# Patient Record
Sex: Female | Born: 1975 | Race: White | Hispanic: No | Marital: Married | Smoking: Current every day smoker
Health system: Southern US, Community
[De-identification: ages and names within clinical notes are randomized; demographics above are authoritative.]

## PROBLEM LIST (undated history)

## (undated) DIAGNOSIS — R519 Headache, unspecified: Secondary | ICD-10-CM

## (undated) DIAGNOSIS — E109 Type 1 diabetes mellitus without complications: Secondary | ICD-10-CM

## (undated) DIAGNOSIS — J449 Chronic obstructive pulmonary disease, unspecified: Secondary | ICD-10-CM

## (undated) DIAGNOSIS — F411 Generalized anxiety disorder: Secondary | ICD-10-CM

## (undated) DIAGNOSIS — J45909 Unspecified asthma, uncomplicated: Secondary | ICD-10-CM

## (undated) DIAGNOSIS — Z973 Presence of spectacles and contact lenses: Secondary | ICD-10-CM

## (undated) DIAGNOSIS — G4730 Sleep apnea, unspecified: Secondary | ICD-10-CM

## (undated) DIAGNOSIS — I10 Essential (primary) hypertension: Secondary | ICD-10-CM

## (undated) DIAGNOSIS — R51 Headache: Secondary | ICD-10-CM

## (undated) DIAGNOSIS — K219 Gastro-esophageal reflux disease without esophagitis: Secondary | ICD-10-CM

## (undated) HISTORY — PX: TONSILLECTOMY: SUR1361

## (undated) HISTORY — PX: SKIN CANCER EXCISION: SHX779

## (undated) HISTORY — DX: Headache: R51

## (undated) HISTORY — DX: Gastro-esophageal reflux disease without esophagitis: K21.9

## (undated) HISTORY — DX: Essential (primary) hypertension: I10

## (undated) HISTORY — DX: Type 1 diabetes mellitus without complications: E10.9

## (undated) HISTORY — PX: DILATION AND CURETTAGE OF UTERUS: SHX78

## (undated) HISTORY — DX: Headache, unspecified: R51.9

## (undated) HISTORY — PX: CHOLECYSTECTOMY: SHX55

## (undated) HISTORY — PX: UPPER GI ENDOSCOPY: SHX6162

## (undated) HISTORY — DX: Unspecified asthma, uncomplicated: J45.909

## (undated) HISTORY — DX: Hemochromatosis, unspecified: E83.119

## (undated) HISTORY — DX: Generalized anxiety disorder: F41.1

## (undated) HISTORY — PX: CARPAL TUNNEL RELEASE: SHX101

---

## 2002-02-16 ENCOUNTER — Emergency Department (HOSPITAL_COMMUNITY): Admission: EM | Admit: 2002-02-16 | Discharge: 2002-02-17 | Payer: Self-pay | Admitting: Emergency Medicine

## 2002-07-05 HISTORY — PX: ABDOMINAL HYSTERECTOMY: SHX81

## 2002-07-05 HISTORY — PX: TUBAL LIGATION: SHX77

## 2004-02-20 ENCOUNTER — Ambulatory Visit (HOSPITAL_BASED_OUTPATIENT_CLINIC_OR_DEPARTMENT_OTHER): Admission: RE | Admit: 2004-02-20 | Discharge: 2004-02-20 | Payer: Self-pay | Admitting: Orthopedic Surgery

## 2004-07-05 HISTORY — PX: BUNIONECTOMY: SHX129

## 2006-10-11 ENCOUNTER — Encounter: Admission: RE | Admit: 2006-10-11 | Discharge: 2006-10-11 | Payer: Self-pay | Admitting: Orthopedic Surgery

## 2006-10-12 ENCOUNTER — Ambulatory Visit (HOSPITAL_BASED_OUTPATIENT_CLINIC_OR_DEPARTMENT_OTHER): Admission: RE | Admit: 2006-10-12 | Discharge: 2006-10-12 | Payer: Self-pay | Admitting: Orthopedic Surgery

## 2013-10-25 ENCOUNTER — Other Ambulatory Visit: Payer: Self-pay | Admitting: Sports Medicine

## 2013-10-25 DIAGNOSIS — M25511 Pain in right shoulder: Secondary | ICD-10-CM

## 2013-10-25 DIAGNOSIS — M542 Cervicalgia: Secondary | ICD-10-CM

## 2013-11-01 ENCOUNTER — Other Ambulatory Visit: Payer: Self-pay

## 2013-11-01 ENCOUNTER — Ambulatory Visit
Admission: RE | Admit: 2013-11-01 | Discharge: 2013-11-01 | Disposition: A | Discharge status: Home / Self Care | Payer: Medicaid Other | Source: Ambulatory Visit | Attending: Sports Medicine | Admitting: Sports Medicine

## 2013-11-01 DIAGNOSIS — M542 Cervicalgia: Secondary | ICD-10-CM

## 2013-11-01 DIAGNOSIS — M25511 Pain in right shoulder: Secondary | ICD-10-CM

## 2013-11-27 NOTE — H&P (Signed)
Donnarae Rae/[facility] [address]   [address] [place], [place] [address] [phone] A Division of [facility]  Loreta Ave, M.D.   Robert A. Thurston Hole, M.D.   Burnell Blanks, M.D.   Eulas Post, M.D.   Lunette Stands, M.D Jewel Baize. Eulah Pont, M.D.  Buford Dresser, M.D.  Estell Harpin, M.D.    Melina Fiddler, M.D. Mary L. Isidoro Donning, PA-C  Kirstin A. Shepperson, PA-C  Josh Ionia, PA-C [place], [place]                                                                    RE: Isabel, Wiley                                    [id number]      DOB: 10-02-1975 PROGRESS NOTE: 11-14-13 Reason for visit: Evaluation, referral from Dr. Farris Has for right shoulder pain.   History of present illness: Isabel Wiley is a pleasant 38 year-old woman who has had Type II diabetes for quite awhile.  She has had right shoulder pain for over a year.  It started, she believes, around the time she was doing a lot of vacuuming and mopping.  She does not remember an acute traumatic injury.  Pain has worsened over the year.  It wakes her up at night.  She has pain daily with most every activity over her shoulders.  She has also noticed some weakness in that shoulder.  She has tried an injection that provided relief for two weeks, but elevated her blood sugars significantly for three weeks.  She has done physical therapy, but this did not provide any relief and she was unable to tolerate additional physical therapy.  She has tried home exercises that did not help and she takes NSAIDs regularly that also do not help.  She is notable for having multiple allergies to multiple medicines.     Please see associated documentation for this clinic visit for further past medical, family, surgical and social history, review of systems, and exam findings as this was reviewed by me.  EXAMINATION: Well appearing female in no apparent distress.  Right upper extremity is  neurovascularly intact.  She has significant tenderness over her AC joint and her biceps tendon.  She has a positive Hawkins.  She has 3+/5 strength in her supraspinatus.  She has good infra and subscapularis strength.  She has a positive O'Brien's as well.    X-RAYS: X-rays reviewed by me: previously obtained plain views demonstrate a Type II acromion.  MRI demonstrates bursal sided partial thickness rotator cuff tear.     ASSESSMENT: She is a 38 year-old woman with likely partial thickness rotator cuff tear, subacromial impingement, biceps tendonitis and distal clavicle arthritis.    PLAN: I had a lengthy discussion with her, over 30 minutes, discussing her options.  She could do nothing, she could continue with physical therapy, she could continue with NSAIDs.  She would not like to do an injection, as it raised her blood sugars significantly for three weeks last time.  Surgical treatment I would recommend would be a manipulation, debridement, distal clavicle excision,  subacromial decompression, biceps tenotomy and evaluation of her rotator cuff for possible repair.  I had a lengthy discussion with her that I am concerned that some of this may have a chronic pain component and we may not get her all the way better.  Understanding this she would like to go forward with the above mentioned surgery, as she has failed all non-operative measures for over a year.      Jewel Baizeimothy D.  Eulah PontMurphy, M.D.  Electronically verified by Jewel Baizeimothy D. Eulah PontMurphy, M.D. TDM:jjh D 11-14-13 T 11-15-13

## 2013-12-04 ENCOUNTER — Encounter (HOSPITAL_BASED_OUTPATIENT_CLINIC_OR_DEPARTMENT_OTHER): Payer: Self-pay

## 2013-12-04 ENCOUNTER — Ambulatory Visit (HOSPITAL_BASED_OUTPATIENT_CLINIC_OR_DEPARTMENT_OTHER): Admit: 2013-12-04 | Payer: Medicaid Other | Admitting: Orthopedic Surgery

## 2013-12-04 SURGERY — SHOULDER ARTHROSCOPY WITH SUBACROMIAL DECOMPRESSION
Anesthesia: General | Site: Shoulder | Laterality: Right

## 2014-01-02 ENCOUNTER — Ambulatory Visit (INDEPENDENT_AMBULATORY_CARE_PROVIDER_SITE_OTHER): Payer: Medicaid Other | Admitting: Endocrinology

## 2014-01-02 ENCOUNTER — Encounter: Payer: Self-pay | Admitting: Endocrinology

## 2014-01-02 DIAGNOSIS — J45909 Unspecified asthma, uncomplicated: Secondary | ICD-10-CM

## 2014-01-02 DIAGNOSIS — J4520 Mild intermittent asthma, uncomplicated: Secondary | ICD-10-CM

## 2014-01-02 DIAGNOSIS — G441 Vascular headache, not elsewhere classified: Secondary | ICD-10-CM

## 2014-01-02 DIAGNOSIS — R51 Headache: Secondary | ICD-10-CM

## 2014-01-02 DIAGNOSIS — F411 Generalized anxiety disorder: Secondary | ICD-10-CM

## 2014-01-02 DIAGNOSIS — I10 Essential (primary) hypertension: Secondary | ICD-10-CM

## 2014-01-02 DIAGNOSIS — E109 Type 1 diabetes mellitus without complications: Secondary | ICD-10-CM

## 2014-01-02 DIAGNOSIS — E1049 Type 1 diabetes mellitus with other diabetic neurological complication: Secondary | ICD-10-CM

## 2014-01-02 DIAGNOSIS — K219 Gastro-esophageal reflux disease without esophagitis: Secondary | ICD-10-CM

## 2014-01-02 MED ORDER — INSULIN ASPART 100 UNIT/ML FLEXPEN: 10.0000 [IU] | PEN_INJECTOR | Freq: Three times a day (TID) | SUBCUTANEOUS | Status: DC

## 2014-01-02 MED ORDER — INSULIN DETEMIR 100 UNIT/ML ~~LOC~~ SOLN: 60.0000 [IU] | Freq: Every day | SUBCUTANEOUS | Status: DC

## 2014-01-02 NOTE — Patient Instructions (Addendum)
good diet and exercise habits significanly improve the control of your diabetes.  please let me know if you wish to be referred to a dietician.  high blood sugar is very risky to your health.  you should see an eye doctor and dentist every year.  You are at higher than average risk for pneumonia and hepatitis-B.  You should be vaccinated against both.   controlling your blood pressure and cholesterol drastically reduces the damage diabetes does to your body.  this also applies to quitting smoking.  please discuss these with your doctor.  check your blood sugar twice a day.  vary the time of day when you check, between before the 3 meals, and at bedtime.  also check if you have symptoms of your blood sugar being too high or too low.  please keep a record of the readings and bring it to your next appointment here.  You can write it on any piece of paper.  please call us sooner if your blood sugar goes below 70, or if you have a lot of readings over 200. Please stop taking the januvia, and: Reduce the levemir to 60 units at bedtime, and: Add novolog, 10 units 3 times a day (just before each meal). Please come back for a follow-up appointment in 2 weeks.

## 2014-01-02 NOTE — Progress Notes (Signed)
Subjective:    Patient ID: Isabel Wiley, female    DOB: 07/07/1975, 38 y.o.   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  she has been on insulin since 2013.  pt says her diet is good, but exercise is limited by health problems.  she has never had GDM, pancreatitis, severe hypoglycemia or DKA.  Pt says cbg's vary from 138-200's.  It is in general higher as the day goes on.   Past Medical History  Diagnosis Date  . Type I (juvenile type) diabetes mellitus without mention of complication, not stated as uncontrolled     No past surgical history on file.  History   Social History  . Marital Status: Married    Spouse Name: N/A    Number of Children: N/A  . Years of Education: N/A   Occupational History  . Not on file.   Social History Main Topics  . Smoking status: Current Every Day Smoker -- 0.50 packs/day    Types: Cigarettes  . Smokeless tobacco: Not on file  . Alcohol Use: No  . Drug Use: Not on file  . Sexual Activity: Not on file   Other Topics Concern  . Not on file   Social History Narrative  . No narrative on file    No current outpatient prescriptions on file prior to visit.   No current facility-administered medications on file prior to visit.    Allergies  Allergen Reactions  . Amoxicillin     Hives   . Cefdinir     Yeast infections.   . Celebrex [Celecoxib]     Stings   . Ciprofloxacin     Hives   . Dilaudid [Hydromorphone Hcl]     Hives   . Doxycycline     Hives   . Fluconazole     Hives/blisters   . Hctz [Hydrochlorothiazide]     Hives   . K-Flex [Orphenadrine]     Hives   . Lovaza [Omega-3-Acid Ethyl Esters]     Causes acid   . Metformin And Related     Hives/ upset stomach   . Morphine And Related     Hives     . Niacin And Related     Itching/hives   . Percocet [Oxycodone-Acetaminophen]     Hives    . Ultram [Tramadol]     Hives    . Welchol [Colesevelam Hcl]     Upset stomach     Family History  Problem Relation Age of Onset  . Diabetes Mother   . Diabetes Maternal Grandmother   . Diabetes Maternal Grandfather     BP 116/82  Pulse 90  Temp(Src) 98.6 F (37 C) (Oral)  Ht 5\' 6"  (1.676 m)  Wt 218 lb (98.884 kg)  BMI 35.20 kg/m2  SpO2 96%   Review of Systems denies weight loss, sob, n/v, memory loss, cold intolerance, rhinorrhea, and easy bruising.  She has blurry vision, polyuria, leg cramps, excessive diaphoresis, depression, and headache.  She says she has had neg heart eval for chest pain.     Objective:   Physical Exam VS: see vs page GEN: no distress HEAD: head: no deformity eyes: no periorbital swelling, no proptosis external nose and ears are normal mouth: no lesion seen NECK: supple, thyroid  is not enlarged CHEST WALL: no deformity LUNGS:  Clear to auscultation.   CV: reg rate and rhythm, no murmur.   ABD: abdomen is soft, nontender.  no hepatosplenomegaly.  not distended.  no hernia MUSCULOSKELETAL: muscle bulk and strength are grossly normal.  no obvious joint swelling.  gait is normal and steady EXTEMITIES: no deformity.  no ulcer on the feet.  feet are of normal color and temp.  Trace bilat leg  edema PULSES: dorsalis pedis intact bilat.  no carotid bruit NEURO:  cn 2-12 grossly intact.   readily moves all 4's.  sensation is intact to touch on the feet, but decreased from normal.   SKIN:  Normal texture and temperature.  No rash or suspicious lesion is visible.   NODES:  None palpable at the neck.   PSYCH: alert, well-oriented.  Does not appear anxious nor depressed.  outside test results are reviewed: A1c=9.2  outside office notes are also reviewed.     Assessment & Plan:  DM: severe exacerbation. Neuropathy: this limits exercise rx of DM: This  impairs the ability to achieve glycemic control.  I'll work around this as best I can. Hemochromatosis: the relationship of this to DM is unclear.  In the absence of exocrine insufficiency, we'll rx as though this is unrelated. Depression, new to me: this often limits rx of DM, but she agrees to take multiple daily injections--good.    Patient is advised the following: Patient Instructions  good diet and exercise habits significanly improve the control of your diabetes.  please let me know if you wish to be referred to a dietician.  high blood sugar is very risky to your health.  you should see an eye doctor and dentist every year.  You are at higher than average risk for pneumonia and hepatitis-B.  You should be vaccinated against both.   controlling your blood pressure and cholesterol drastically reduces the damage diabetes does to your body.  this also applies to quitting smoking.  please discuss these with your doctor.  check your blood sugar twice a day.  vary the time of day when you check, between before the 3 meals, and at bedtime.  also check if you have symptoms of your blood sugar being too high or too low.  please keep a record of the readings and bring it to your next appointment here.  You can write it on any piece of paper.  please call us sooner if your blood sugar goes below 70, or if you have a lot of readings over 200. Please stop taking the januvia, and: Reduce the levemir to 60 units at bedtime, and: Add novolog, 10 units 3 times a day (just before each meal). Please come back for a follow-up appointment in 2 weeks.

## 2014-01-04 ENCOUNTER — Encounter: Payer: Self-pay | Admitting: Endocrinology

## 2014-01-04 DIAGNOSIS — R519 Headache, unspecified: Secondary | ICD-10-CM | POA: Insufficient documentation

## 2014-01-04 DIAGNOSIS — R51 Headache: Secondary | ICD-10-CM

## 2014-01-04 DIAGNOSIS — E1049 Type 1 diabetes mellitus with other diabetic neurological complication: Secondary | ICD-10-CM | POA: Insufficient documentation

## 2014-01-04 DIAGNOSIS — K219 Gastro-esophageal reflux disease without esophagitis: Secondary | ICD-10-CM | POA: Insufficient documentation

## 2014-01-04 DIAGNOSIS — F411 Generalized anxiety disorder: Secondary | ICD-10-CM | POA: Insufficient documentation

## 2014-01-04 DIAGNOSIS — J45909 Unspecified asthma, uncomplicated: Secondary | ICD-10-CM | POA: Insufficient documentation

## 2014-01-04 DIAGNOSIS — I10 Essential (primary) hypertension: Secondary | ICD-10-CM | POA: Insufficient documentation

## 2014-01-07 ENCOUNTER — Telehealth: Payer: Self-pay | Admitting: Endocrinology

## 2014-01-07 NOTE — Telephone Encounter (Signed)
Patient states that the Novolog 10 Units is not working  Her blood sugars have been 257-300  Please advise patient   Thank You

## 2014-01-07 NOTE — Telephone Encounter (Signed)
See below and please advise. Thanks!  

## 2014-01-07 NOTE — Telephone Encounter (Signed)
Ok, please increase novolog to 20 units 3 times a day (just before each meal). Please call if this does not help

## 2014-01-07 NOTE — Telephone Encounter (Signed)
Noted, pt advised

## 2014-01-11 NOTE — Telephone Encounter (Signed)
See below and please advise, Thanks!  

## 2014-01-11 NOTE — Telephone Encounter (Signed)
The blood sugar is itself most likely causing the headaches.  please increase novolog to 30 units 3 times a day (just before each meal)

## 2014-01-11 NOTE — Telephone Encounter (Signed)
Noted pt. advised

## 2014-01-11 NOTE — Telephone Encounter (Signed)
Patient stated that Dr went up novalog 20 units 3 times day. Levemir 60 unit at bedtime.   Today still not coming down highest 301 lowest is 203. Patient believes levemir is causing her to have headaches,  Please advise.

## 2014-01-14 ENCOUNTER — Telehealth: Payer: Self-pay

## 2014-01-14 NOTE — Telephone Encounter (Signed)
Blood sugar isn't good enough for this yet.

## 2014-01-14 NOTE — Telephone Encounter (Signed)
Noted, pt advised

## 2014-01-14 NOTE — Telephone Encounter (Signed)
Pt called requesting a medical clearance letter for her diabetes be faxed to Dr. Renea Eeox's [place] in [place].  Please advise, Thanks!

## 2014-01-16 ENCOUNTER — Ambulatory Visit: Payer: Medicaid Other | Admitting: Endocrinology

## 2014-01-17 ENCOUNTER — Telehealth: Payer: Self-pay | Admitting: Endocrinology

## 2014-01-17 ENCOUNTER — Other Ambulatory Visit: Payer: Self-pay | Admitting: *Deleted

## 2014-01-17 MED ORDER — INSULIN ASPART 100 UNIT/ML FLEXPEN: 10.0000 [IU] | PEN_INJECTOR | Freq: Three times a day (TID) | SUBCUTANEOUS | Status: DC

## 2014-01-17 NOTE — Telephone Encounter (Signed)
novolog rx needs to be called in pt is completely out   She is requesting a new rx to be sent in for her new dosage of 30 u three times daily pt would like to have vials no the flexpen

## 2014-01-17 NOTE — Telephone Encounter (Signed)
rx sent

## 2014-01-18 ENCOUNTER — Ambulatory Visit (INDEPENDENT_AMBULATORY_CARE_PROVIDER_SITE_OTHER): Payer: Medicaid Other | Admitting: Endocrinology

## 2014-01-18 ENCOUNTER — Encounter: Payer: Self-pay | Admitting: Endocrinology

## 2014-01-18 VITALS — BP 118/70 | HR 86 | Temp 98.2°F | Ht 66.0 in | Wt 220.0 lb

## 2014-01-18 DIAGNOSIS — E1049 Type 1 diabetes mellitus with other diabetic neurological complication: Secondary | ICD-10-CM

## 2014-01-18 MED ORDER — ACCU-CHEK AVIVA PLUS W/DEVICE KIT: 1.0000 | PACK | Freq: Once | Status: AC

## 2014-01-18 MED ORDER — GLUCOSE BLOOD VI STRP: 1.0000 | ORAL_STRIP | Freq: Two times a day (BID) | Status: AC

## 2014-01-18 MED ORDER — INSULIN ASPART 100 UNIT/ML ~~LOC~~ SOLN: 40.0000 [IU] | Freq: Three times a day (TID) | SUBCUTANEOUS | Status: DC

## 2014-01-18 NOTE — Patient Instructions (Addendum)
check your blood sugar twice a day.  vary the time of day when you check, between before the 3 meals, and at bedtime.  also check if you have symptoms of your blood sugar being too high or too low.  please keep a record of the readings and bring it to your next appointment here.  You can write it on any piece of paper.  please call us sooner if your blood sugar goes below 70, or if you have a lot of readings over 200. Please reduce the levemir to 50 units at bedtime, and: Increase novolog to 40 units 3 times a day (just before each meal). Please come back for a follow-up appointment in 2 months. your surgical risk is low and outweighed by the potential benefit of the surgery.  You are therefore medically , from the standpoint of diabetes.

## 2014-01-18 NOTE — Progress Notes (Signed)
Subjective:    Patient ID: Isabel Wiley, female    DOB: 1976/01/22, 38 y.o.   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  novolog has been increased to 30 units 3 times a day (just before each meal).  she brings a record of her cbg's which i have reviewed today.  It varies from 108-214.  It is in general higher as the day goes on. Past Medical History  Diagnosis Date  . Type I (juvenile type) diabetes mellitus without mention of complication, not stated as uncontrolled   . Hemochromatosis   . GERD (gastroesophageal reflux disease)   . Asthma   . Anxiety state, unspecified   . HTN (hypertension)   . Headache     No past surgical history on file.  History   Social History  . Marital Status: Married    Spouse Name: N/A    Number of Children: N/A  . Years of Education: N/A   Occupational History  . Not on file.   Social History Main Topics  . Smoking status: Current Every Day Smoker -- 0.50 packs/day    Types: Cigarettes  . Smokeless tobacco: Not on file  . Alcohol Use: No  . Drug Use: Not on file  . Sexual Activity: Not on file   Other Topics Concern  . Not on file   Social History Narrative  . No narrative on file    Current Outpatient Prescriptions on File Prior to Visit  Medication Sig Dispense Refill  . albuterol (PROVENTIL) (5 MG/ML) 0.5% nebulizer solution Take 2.5 mg by nebulization every 6 (six) hours as needed for wheezing or shortness of breath.      Marland Kitchen atorvastatin (LIPITOR) 10 MG tablet Take 10 mg by mouth daily.      . beclomethasone (QVAR) 40 MCG/ACT inhaler Inhale into the lungs 2 (two) times daily.      .  butalbital-acetaminophen-caffeine (FIORICET WITH CODEINE) 50-325-40-30 MG per capsule Take 1 capsule by mouth every 4 (four) hours as needed for headache.      . cetirizine (ZYRTEC) 10 MG tablet Take 10 mg by mouth daily.      . Choline Fenofibrate (TRILIPIX) 135 MG capsule Take 135 mg by mouth daily.      . diazepam (VALIUM) 5 MG tablet Take 5 mg by mouth every 6 (six) hours as needed for anxiety.      . docusate sodium (COLACE) 100 MG capsule Take 100 mg by mouth 2 (two) times daily.      Marland Kitchen esomeprazole (NEXIUM) 40 MG capsule Take 40 mg by mouth daily at 12 noon.      . gabapentin (NEURONTIN) 800 MG tablet Take 800 mg by mouth 3 (three) times daily.      Marland Kitchen HYDROcodone-acetaminophen (NORCO/VICODIN) 5-325 MG per tablet Take 1 tablet by mouth every 6 (six) hours as needed for moderate pain.      Marland Kitchen ibuprofen (ADVIL,MOTRIN) 800 MG tablet Take 800 mg by mouth every 8 (eight) hours as needed.      Marland Kitchen losartan (COZAAR) 50 MG tablet Take 50 mg by mouth daily.      . Olopatadine HCl (PATADAY) 0.2 % SOLN Apply  to eye.      . promethazine (PHENERGAN) 25 MG tablet Take 25 mg by mouth every 6 (six) hours as needed for nausea or vomiting.      . SUMAtriptan (IMITREX) 100 MG tablet Take 100 mg by mouth every 2 (two) hours as needed for migraine or headache. May repeat in 2 hours if headache persists or recurs.       No current facility-administered medications on file prior to visit.    Allergies  Allergen Reactions  . Amoxicillin     Hives   . Cefdinir     Yeast infections.   . Celebrex [Celecoxib]     Stings   . Ciprofloxacin     Hives   . Dilaudid [Hydromorphone Hcl]     Hives   . Doxycycline     Hives   . Fluconazole     Hives/blisters   . Hctz [Hydrochlorothiazide]     Hives   . K-Flex [Orphenadrine]     Hives   . Lovaza [Omega-3-Acid Ethyl Esters]     Causes acid   . Metformin And Related     Hives/ upset stomach   . Morphine And Related     Hives   . Niacin And Related      Itching/hives   . Percocet [Oxycodone-Acetaminophen]     Hives    . Ultram [Tramadol]     Hives    . Welchol [Colesevelam Hcl]     Upset stomach     Family History  Problem Relation Age of Onset  . Diabetes Mother   . Diabetes Maternal Grandmother   . Diabetes Maternal Grandfather     BP 118/70  Pulse 86  Temp(Src) 98.2 F (36.8 C) (Oral)  Ht 5\' 6"  (1.676 m)  Wt 220 lb (99.791 kg)  BMI 35.53 kg/m2   Review of Systems She denies hypoglycemia and weight change.      Objective:   Physical Exam VITAL SIGNS:  See vs page GENERAL: no distress SKIN:  Insulin injection sites at the anterior abdomen are normal.        Assessment & Plan:  DM: mild exacerbation, but much improved. Shoulder pain: new to me: the benefits of surgery outweigh risks.     Patient is advised the following: Patient Instructions  check your blood sugar twice a day.  vary the time of day when you check, between before the 3 meals, and at bedtime.  also check if you have symptoms of your blood sugar being too high or too low.  please keep a record of the readings and bring it to your next appointment here.  You can write it on any piece of paper.  please call us sooner if your blood sugar goes below 70, or if you have a lot of readings over 200. Please reduce the levemir to 50 units at bedtime, and: Increase novolog to 40 units 3 times a day (just before each meal). Please come back for a follow-up appointment in 2 months. your surgical risk is low and outweighed by the potential benefit of the surgery.  You are therefore medically , from the standpoint of diabetes.

## 2014-02-04 ENCOUNTER — Encounter (HOSPITAL_BASED_OUTPATIENT_CLINIC_OR_DEPARTMENT_OTHER): Payer: Self-pay | Admitting: *Deleted

## 2014-02-04 NOTE — Progress Notes (Signed)
Pt diabetic-had a stress test 2013-pcp sent her-[facility] did not have records of her-she has sleep apnea-sent cpap back-could not use To bring all meds and overnight bag-to call pcp for any records

## 2014-02-04 NOTE — Progress Notes (Signed)
02/04/14 1250  OBSTRUCTIVE SLEEP APNEA  Have you ever been diagnosed with sleep apnea through a sleep study? Yes  If yes, do you have and use a CPAP or BPAP machine every night? 0  Do you snore loudly (loud enough to be heard through closed doors)?  1  Do you often feel tired, fatigued, or sleepy during the daytime? 1  Has anyone observed you stop breathing during your sleep? 1  Do you have, or are you being treated for high blood pressure? 0  BMI more than 35 kg/m2? 1  Age over 40 years old? 0  Neck circumference greater than 40 cm/16 inches? 1  Gender: 0  Obstructive Sleep Apnea Score 5  Score 4 or greater  Results sent to PCP

## 2014-02-04 NOTE — Progress Notes (Signed)
Reviewed with dr crews-ok 

## 2014-02-08 ENCOUNTER — Ambulatory Visit (HOSPITAL_BASED_OUTPATIENT_CLINIC_OR_DEPARTMENT_OTHER): Payer: Medicaid Other | Admitting: Anesthesiology

## 2014-02-08 ENCOUNTER — Encounter (HOSPITAL_BASED_OUTPATIENT_CLINIC_OR_DEPARTMENT_OTHER): Payer: Medicaid Other | Admitting: Anesthesiology

## 2014-02-08 ENCOUNTER — Ambulatory Visit (HOSPITAL_BASED_OUTPATIENT_CLINIC_OR_DEPARTMENT_OTHER)
Admission: RE | Admit: 2014-02-08 | Discharge: 2014-02-08 | Disposition: A | Discharge status: Home / Self Care | Payer: Medicaid Other | Source: Ambulatory Visit | Attending: Orthopedic Surgery | Admitting: Orthopedic Surgery

## 2014-02-08 ENCOUNTER — Encounter (HOSPITAL_BASED_OUTPATIENT_CLINIC_OR_DEPARTMENT_OTHER)
Admission: RE | Disposition: A | Discharge status: Home / Self Care | Payer: Self-pay | Source: Ambulatory Visit | Attending: Orthopedic Surgery

## 2014-02-08 ENCOUNTER — Encounter (HOSPITAL_BASED_OUTPATIENT_CLINIC_OR_DEPARTMENT_OTHER): Payer: Self-pay

## 2014-02-08 DIAGNOSIS — E119 Type 2 diabetes mellitus without complications: Secondary | ICD-10-CM | POA: Insufficient documentation

## 2014-02-08 DIAGNOSIS — M19019 Primary osteoarthritis, unspecified shoulder: Secondary | ICD-10-CM | POA: Diagnosis not present

## 2014-02-08 DIAGNOSIS — M7500 Adhesive capsulitis of unspecified shoulder: Secondary | ICD-10-CM | POA: Insufficient documentation

## 2014-02-08 HISTORY — DX: Chronic obstructive pulmonary disease, unspecified: J44.9

## 2014-02-08 HISTORY — DX: Presence of spectacles and contact lenses: Z97.3

## 2014-02-08 HISTORY — DX: Sleep apnea, unspecified: G47.30

## 2014-02-08 HISTORY — PX: ARTHOSCOPIC ROTAOR CUFF REPAIR: SHX5002

## 2014-02-08 LAB — POCT I-STAT, CHEM 8
BUN: 12 mg/dL (ref 6–23)
CALCIUM ION: 1.16 mmol/L (ref 1.12–1.23)
CHLORIDE: 104 meq/L (ref 96–112)
CREATININE: 0.8 mg/dL (ref 0.50–1.10)
GLUCOSE: 147 mg/dL — AB (ref 70–99)
HCT: 44 % (ref 36.0–46.0)
Hemoglobin: 15 g/dL (ref 12.0–15.0)
POTASSIUM: 3.9 meq/L (ref 3.7–5.3)
Sodium: 137 mEq/L (ref 137–147)
TCO2: 23 mmol/L (ref 0–100)

## 2014-02-08 LAB — GLUCOSE, CAPILLARY: Glucose-Capillary: 146 mg/dL — ABNORMAL HIGH (ref 70–99)

## 2014-02-08 SURGERY — SHOULDER ARTHROSCOPY WITH SUBACROMIAL DECOMPRESSION AND DISTAL CLAVICLE EXCISION
Anesthesia: General | Site: Shoulder | Laterality: Right

## 2014-02-08 MED ORDER — FENTANYL CITRATE 0.05 MG/ML IJ SOLN
50.0000 ug | INTRAMUSCULAR | Status: DC | PRN
  Administered 2014-02-08: 100 ug via INTRAVENOUS

## 2014-02-08 MED ORDER — BUPIVACAINE-EPINEPHRINE (PF) 0.5% -1:200000 IJ SOLN
INTRAMUSCULAR | Status: DC | PRN
  Administered 2014-02-08: 30 mL via PERINEURAL

## 2014-02-08 MED ORDER — MIDAZOLAM HCL 2 MG/2ML IJ SOLN
INTRAMUSCULAR | Status: AC
  Filled 2014-02-08: qty 2

## 2014-02-08 MED ORDER — OXYCODONE HCL 5 MG/5ML PO SOLN: 5.0000 mg | Freq: Once | ORAL | Status: DC | PRN

## 2014-02-08 MED ORDER — DEXAMETHASONE SODIUM PHOSPHATE 4 MG/ML IJ SOLN
INTRAMUSCULAR | Status: DC | PRN
  Administered 2014-02-08: 8 mg via INTRAVENOUS

## 2014-02-08 MED ORDER — SODIUM CHLORIDE 0.9 % IR SOLN
Status: DC | PRN
  Administered 2014-02-08: 12000 mL

## 2014-02-08 MED ORDER — FENTANYL CITRATE 0.05 MG/ML IJ SOLN
INTRAMUSCULAR | Status: AC
  Filled 2014-02-08: qty 2

## 2014-02-08 MED ORDER — ONDANSETRON HCL 4 MG/2ML IJ SOLN: 4.0000 mg | Freq: Once | INTRAMUSCULAR | Status: DC | PRN

## 2014-02-08 MED ORDER — FENTANYL CITRATE 0.05 MG/ML IJ SOLN
INTRAMUSCULAR | Status: AC
  Filled 2014-02-08: qty 6

## 2014-02-08 MED ORDER — MEPERIDINE HCL 25 MG/ML IJ SOLN: 6.2500 mg | INTRAMUSCULAR | Status: DC | PRN

## 2014-02-08 MED ORDER — ONDANSETRON HCL 4 MG/2ML IJ SOLN
INTRAMUSCULAR | Status: DC | PRN
  Administered 2014-02-08: 4 mg via INTRAVENOUS

## 2014-02-08 MED ORDER — ACETAMINOPHEN 500 MG PO TABS
ORAL_TABLET | ORAL | Status: AC
Start: 2014-02-08 — End: 2014-02-08
  Filled 2014-02-08: qty 2

## 2014-02-08 MED ORDER — OXYCODONE HCL 5 MG PO TABS: 5.0000 mg | ORAL_TABLET | Freq: Once | ORAL | Status: DC | PRN

## 2014-02-08 MED ORDER — HYDROCODONE-ACETAMINOPHEN 5-325 MG PO TABS: 1.0000 | ORAL_TABLET | ORAL | Status: AC | PRN

## 2014-02-08 MED ORDER — CLINDAMYCIN PHOSPHATE 600 MG/50ML IV SOLN
INTRAVENOUS | Status: DC | PRN
  Administered 2014-02-08: 600 mg via INTRAVENOUS

## 2014-02-08 MED ORDER — ACETAMINOPHEN 500 MG PO TABS
1000.0000 mg | ORAL_TABLET | Freq: Once | ORAL | Status: AC
  Administered 2014-02-08: 1000 mg via ORAL

## 2014-02-08 MED ORDER — CEFAZOLIN SODIUM-DEXTROSE 2-3 GM-% IV SOLR: 2.0000 g | INTRAVENOUS | Status: DC

## 2014-02-08 MED ORDER — FENTANYL CITRATE 0.05 MG/ML IJ SOLN
INTRAMUSCULAR | Status: DC | PRN
  Administered 2014-02-08: 100 ug via INTRAVENOUS

## 2014-02-08 MED ORDER — MIDAZOLAM HCL 2 MG/2ML IJ SOLN
1.0000 mg | INTRAMUSCULAR | Status: DC | PRN
  Administered 2014-02-08: 2 mg via INTRAVENOUS

## 2014-02-08 MED ORDER — NEOSTIGMINE METHYLSULFATE 10 MG/10ML IV SOLN
INTRAVENOUS | Status: DC | PRN
  Administered 2014-02-08: 3 mg via INTRAVENOUS

## 2014-02-08 MED ORDER — SUCCINYLCHOLINE CHLORIDE 20 MG/ML IJ SOLN
INTRAMUSCULAR | Status: DC | PRN
  Administered 2014-02-08: 120 mg via INTRAVENOUS

## 2014-02-08 MED ORDER — GLYCOPYRROLATE 0.2 MG/ML IJ SOLN
INTRAMUSCULAR | Status: DC | PRN
  Administered 2014-02-08: 0.4 mg via INTRAVENOUS

## 2014-02-08 MED ORDER — ROCURONIUM BROMIDE 100 MG/10ML IV SOLN
INTRAVENOUS | Status: DC | PRN
  Administered 2014-02-08: 20 mg via INTRAVENOUS

## 2014-02-08 MED ORDER — PROPOFOL 10 MG/ML IV BOLUS
INTRAVENOUS | Status: DC | PRN
  Administered 2014-02-08: 120 mg via INTRAVENOUS
  Administered 2014-02-08: 80 mg via INTRAVENOUS
  Administered 2014-02-08: 60 mg via INTRAVENOUS

## 2014-02-08 MED ORDER — DOCUSATE SODIUM 100 MG PO CAPS: 100.0000 mg | ORAL_CAPSULE | Freq: Two times a day (BID) | ORAL | Status: AC

## 2014-02-08 MED ORDER — HYDROMORPHONE HCL PF 1 MG/ML IJ SOLN: 0.2500 mg | INTRAMUSCULAR | Status: DC | PRN

## 2014-02-08 MED ORDER — DEXTROSE-NACL 5-0.45 % IV SOLN: 100.0000 mL/h | INTRAVENOUS | Status: DC

## 2014-02-08 MED ORDER — LIDOCAINE HCL (CARDIAC) 20 MG/ML IV SOLN
INTRAVENOUS | Status: DC | PRN
  Administered 2014-02-08: 100 mg via INTRAVENOUS

## 2014-02-08 MED ORDER — LACTATED RINGERS IV SOLN
INTRAVENOUS | Status: DC
  Administered 2014-02-08 (×2): via INTRAVENOUS

## 2014-02-08 MED ORDER — ONDANSETRON HCL 4 MG PO TABS: 4.0000 mg | ORAL_TABLET | Freq: Three times a day (TID) | ORAL | Status: AC | PRN

## 2014-02-08 MED ORDER — EPHEDRINE SULFATE 50 MG/ML IJ SOLN
INTRAMUSCULAR | Status: DC | PRN
  Administered 2014-02-08: 10 mg via INTRAVENOUS
  Administered 2014-02-08: 5 mg via INTRAVENOUS

## 2014-02-08 MED ORDER — CLINDAMYCIN PHOSPHATE 600 MG/50ML IV SOLN
INTRAVENOUS | Status: AC
  Filled 2014-02-08: qty 50

## 2014-02-08 SURGICAL SUPPLY — 74 items
ANCH SUT SWLK 19.1X4.75 (Anchor) ×1 IMPLANT
ANCHOR SUT BIO SW 4.75X19.1 (Anchor) ×2 IMPLANT
BLADE CUTTER GATOR 3.5 (BLADE) ×3 IMPLANT
BLADE CUTTER MENIS 5.5 (BLADE) IMPLANT
BLADE GREAT WHITE 4.2 (BLADE) IMPLANT
BLADE GREAT WHITE 4.2MM (BLADE)
BUR OVAL 4.0 (BURR) IMPLANT
BUR OVAL 6.0 (BURR) ×3 IMPLANT
CANISTER SUCT 3000ML (MISCELLANEOUS) IMPLANT
CANNULA 5.75X71 LONG (CANNULA) IMPLANT
CANNULA TWIST IN 8.25X7CM (CANNULA) IMPLANT
CHLORAPREP W/TINT 26ML (MISCELLANEOUS) ×3 IMPLANT
CLOSURE WOUND 1/2 X4 (GAUZE/BANDAGES/DRESSINGS) ×1
DECANTER SPIKE VIAL GLASS SM (MISCELLANEOUS) IMPLANT
DRAPE INCISE IOBAN 66X45 STRL (DRAPES) ×3 IMPLANT
DRAPE SHOULDER BEACH CHAIR (DRAPES) ×4 IMPLANT
DRAPE U 20/CS (DRAPES) ×3 IMPLANT
DRAPE U-SHAPE 47X51 STRL (DRAPES) ×3 IMPLANT
DRSG PAD ABDOMINAL 8X10 ST (GAUZE/BANDAGES/DRESSINGS) ×3 IMPLANT
ELECT NDL TIP 2.8 STRL (NEEDLE) IMPLANT
ELECT NEEDLE TIP 2.8 STRL (NEEDLE) IMPLANT
ELECT REM PT RETURN 9FT ADLT (ELECTROSURGICAL) ×3
ELECTRODE REM PT RTRN 9FT ADLT (ELECTROSURGICAL) ×1 IMPLANT
GAUZE SPONGE 4X4 12PLY STRL (GAUZE/BANDAGES/DRESSINGS) ×3 IMPLANT
GAUZE XEROFORM 1X8 LF (GAUZE/BANDAGES/DRESSINGS) IMPLANT
GLOVE BIO SURGEON STRL SZ7.5 (GLOVE) ×3 IMPLANT
GLOVE BIO SURGEON STRL SZ8 (GLOVE) ×2 IMPLANT
GLOVE BIOGEL PI IND STRL 8 (GLOVE) ×2 IMPLANT
GLOVE BIOGEL PI IND STRL 8.5 (GLOVE) IMPLANT
GLOVE BIOGEL PI INDICATOR 8 (GLOVE) ×4
GLOVE BIOGEL PI INDICATOR 8.5 (GLOVE)
GLOVE EXAM NITRILE MD LF STRL (GLOVE) ×2 IMPLANT
GLOVE SURG SS PI 7.0 STRL IVOR (GLOVE) ×2 IMPLANT
GOWN STRL REUS W/ TWL LRG LVL3 (GOWN DISPOSABLE) ×2 IMPLANT
GOWN STRL REUS W/ TWL XL LVL3 (GOWN DISPOSABLE) ×1 IMPLANT
GOWN STRL REUS W/TWL LRG LVL3 (GOWN DISPOSABLE) ×6
GOWN STRL REUS W/TWL XL LVL3 (GOWN DISPOSABLE) ×3
MANIFOLD NEPTUNE II (INSTRUMENTS) ×3 IMPLANT
NDL SCORPION MULTI FIRE (NEEDLE) IMPLANT
NDL SUT 6 .5 CRC .975X.05 MAYO (NEEDLE) IMPLANT
NEEDLE MAYO TAPER (NEEDLE)
NEEDLE SCORPION MULTI FIRE (NEEDLE) ×3 IMPLANT
NS IRRIG 1000ML POUR BTL (IV SOLUTION) IMPLANT
PACK ARTHROSCOPY DSU (CUSTOM PROCEDURE TRAY) ×3 IMPLANT
PACK BASIN DAY SURGERY FS (CUSTOM PROCEDURE TRAY) ×3 IMPLANT
PENCIL BUTTON HOLSTER BLD 10FT (ELECTRODE) IMPLANT
SET ARTHROSCOPY TUBING (MISCELLANEOUS) ×3
SET ARTHROSCOPY TUBING LN (MISCELLANEOUS) ×1 IMPLANT
SLEEVE SCD COMPRESS KNEE MED (MISCELLANEOUS) ×3 IMPLANT
SLING ARM IMMOBILIZER LRG (SOFTGOODS) ×2 IMPLANT
SLING ARM IMMOBILIZER MED (SOFTGOODS) IMPLANT
SLING ARM LRG ADULT FOAM STRAP (SOFTGOODS) IMPLANT
SLING ARM MED ADULT FOAM STRAP (SOFTGOODS) IMPLANT
SLING ARM XL FOAM STRAP (SOFTGOODS) IMPLANT
SPONGE LAP 4X18 X RAY DECT (DISPOSABLE) IMPLANT
STRIP CLOSURE SKIN 1/2X4 (GAUZE/BANDAGES/DRESSINGS) ×1 IMPLANT
SUCTION FRAZIER TIP 10 FR DISP (SUCTIONS) IMPLANT
SUT ETHIBOND 2 OS 4 DA (SUTURE) IMPLANT
SUT ETHILON 2 0 FS 18 (SUTURE) IMPLANT
SUT ETHILON 3 0 PS 1 (SUTURE) IMPLANT
SUT FIBERWIRE #2 38 T-5 BLUE (SUTURE)
SUT MNCRL AB 3-0 PS2 18 (SUTURE) ×3 IMPLANT
SUT TIGER TAPE 7 IN WHITE (SUTURE) ×2 IMPLANT
SUT VIC AB 0 CT1 27 (SUTURE)
SUT VIC AB 0 CT1 27XBRD ANBCTR (SUTURE) IMPLANT
SUT VIC AB 2-0 SH 27 (SUTURE)
SUT VIC AB 2-0 SH 27XBRD (SUTURE) IMPLANT
SUT VIC AB 3-0 FS2 27 (SUTURE) IMPLANT
SUTURE FIBERWR #2 38 T-5 BLUE (SUTURE) IMPLANT
TAPE FIBER 2MM 7IN #2 BLUE (SUTURE) IMPLANT
TOWEL OR 17X24 6PK STRL BLUE (TOWEL DISPOSABLE) ×3 IMPLANT
TOWEL OR NON WOVEN STRL DISP B (DISPOSABLE) ×3 IMPLANT
WAND STAR VAC 90 (SURGICAL WAND) ×3 IMPLANT
YANKAUER SUCT BULB TIP NO VENT (SUCTIONS) IMPLANT

## 2014-02-08 NOTE — Anesthesia Procedure Notes (Addendum)
Anesthesia Regional Block:  Interscalene brachial plexus block  Pre-Anesthetic Checklist: ,, timeout performed, Correct Patient, Correct Site, Correct Laterality, Correct Procedure, Correct Position, site marked, Risks and benefits discussed,  Surgical consent,  Pre-op evaluation,  At surgeon's request and post-op pain management  Laterality: Right  Prep: chloraprep       Needles:  Injection technique: Single-shot  Needle Type: Echogenic Stimulator Needle     Needle Length: 5cm 5 cm     Additional Needles:  Procedures: ultrasound guided (picture in chart) and nerve stimulator Interscalene brachial plexus block  Nerve Stimulator or Paresthesia:  Response: 0.4 mA,   Additional Responses:   Narrative:  Start time: 02/08/2014 11:40 AM End time: 02/08/2014 11:50 AM Injection made incrementally with aspirations every 5 mL.  Performed by: Personally  Anesthesiologist: Arta BruceKevin Ossey MD  Additional Notes: Monitors applied. Patient sedated. Sterile prep and drape,hand hygiene and sterile gloves were used. Relevant anatomy identified.Needle position confirmed.Local anesthetic injected incrementally after negative aspiration. Local anesthetic spread visualized around nerve(s). Vascular puncture avoided. No complications. Image printed for medical record.The patient tolerated the procedure well.        Procedure Name: Intubation Date/Time: 02/08/2014 1:04 PM Performed by: Renella CunasHAZEL, Genelle Economou D Pre-anesthesia Checklist: Patient identified, Emergency Drugs available, Suction available and Patient being monitored Patient Re-evaluated:Patient Re-evaluated prior to inductionOxygen Delivery Method: Circle System Utilized Preoxygenation: Pre-oxygenation with 100% oxygen Intubation Type: IV induction Ventilation: Mask ventilation without difficulty Laryngoscope Size: Mac and 3 Grade View: Grade I Tube type: Oral Tube size: 7.0 mm Number of attempts: 1 Airway Equipment and Method: stylet and oral  airway Placement Confirmation: ETT inserted through vocal cords under direct vision,  positive ETCO2 and breath sounds checked- equal and bilateral Secured at: 22 cm Tube secured with: Tape Dental Injury: Teeth and Oropharynx as per pre-operative assessment

## 2014-02-08 NOTE — Anesthesia Postprocedure Evaluation (Signed)
Anesthesia Post Note  Patient: Jones Balesllison Safer  Procedure(s) Performed: Procedure(s) (LRB): RIGHT SHOULDER MANIPULATION UNDER ANESTHESIA; ARTHROSCOPY SHOULDER;  DISTAL CLAVICULECTOMY, EXTENSIVE DEBRIDEMENT; SUBACROMIAL DECOMPRESSION;  PARTIAL  ACROMIOPLASTY WITH CORACOACROMIAL RELEASE (Right) ARTHROSCOPIC ROTATOR CUFF REPAIR (Right)  Anesthesia type: general  Patient location: PACU  Post pain: Pain level controlled  Post assessment: Patient's Cardiovascular Status Stable  Last Vitals:  Filed Vitals:   02/08/14 1515  BP: 135/75  Pulse: 98  Temp:   Resp: 18    Post vital signs: Reviewed and stable  Level of consciousness: sedated  Complications: No apparent anesthesia complications

## 2014-02-08 NOTE — Transfer of Care (Signed)
Immediate Anesthesia Transfer of Care Note  Patient: Isabel Wiley  Procedure(s) Performed: Procedure(s) (LRB): RIGHT SHOULDER MANIPULATION UNDER ANESTHESIA; ARTHROSCOPY SHOULDER;  DISTAL CLAVICULECTOMY, EXTENSIVE DEBRIDEMENT; SUBACROMIAL DECOMPRESSION;  PARTIAL  ACROMIOPLASTY WITH CORACOACROMIAL RELEASE (Right) ARTHROSCOPIC ROTATOR CUFF REPAIR (Right)  Patient Location: PACU  Anesthesia Type: General  Level of Consciousness: awake, oriented, sedated and patient cooperative  Airway & Oxygen Therapy: Patient Spontanous Breathing and Patient connected to face mask oxygen  Post-op Assessment: Report given to PACU RN and Post -op Vital signs reviewed and stable  Post vital signs: Reviewed and stable  Complications: No apparent anesthesia complications

## 2014-02-08 NOTE — Discharge Instructions (Signed)
Sling full time.  You may remove your dressings on Wednesday and shower then, no soaking    Post Anesthesia Home Care Instructions  Activity: Get plenty of rest for the remainder of the day. A responsible adult should stay with you for 24 hours following the procedure.  For the next 24 hours, DO NOT: -Drive a car -Advertising copywriterperate machinery -Drink alcoholic beverages -Take any medication unless instructed by your physician -Make any legal decisions or sign important papers.  Meals: Start with liquid foods such as gelatin or soup. Progress to regular foods as tolerated. Avoid greasy, spicy, heavy foods. If nausea and/or vomiting occur, drink only clear liquids until the nausea and/or vomiting subsides. Call your physician if vomiting continues.  Special Instructions/Symptoms: Your throat may feel dry or sore from the anesthesia or the breathing tube placed in your throat during surgery. If this causes discomfort, gargle with warm salt water. The discomfort should disappear within 24 hours.  Regional Anesthesia Blocks  1. Numbness or the inability to move the "blocked" extremity may last from 3-48 hours after placement. The length of time depends on the medication injected and your individual response to the medication. If the numbness is not going away after 48 hours, call your surgeon.  2. The extremity that is blocked will need to be protected until the numbness is gone and the  Strength has returned. Because you cannot feel it, you will need to take extra care to avoid injury. Because it may be weak, you may have difficulty moving it or using it. You may not know what position it is in without looking at it while the block is in effect.  3. For blocks in the legs and feet, returning to weight bearing and walking needs to be done carefully. You will need to wait until the numbness is entirely gone and the strength has returned. You should be able to move your leg and foot normally before you try  and bear weight or walk. You will need someone to be with you when you first try to ensure you do not fall and possibly risk injury.  4. Bruising and tenderness at the needle site are common side effects and will resolve in a few days.  5. Persistent numbness or new problems with movement should be communicated to the surgeon or the [facility] [phone] [facility] [phone]).   Call your surgeon if you experience:   1.  Fever over 101.0. 2.  Inability to urinate. 3.  Nausea and/or vomiting. 4.  Extreme swelling or bruising at the surgical site. 5.  Continued bleeding from the incision. 6.  Increased pain, redness or drainage from the incision. 7.  Problems related to your pain medication. 8. Any change in color, movement and/or sensation 9. Any problems and/or concerns

## 2014-02-08 NOTE — H&P (Signed)
[facility] [address]   [address] [place], [place] [address] [phone] A Division of [facility]  Loreta Ave, M.D.   Robert A. Thurston Hole, M.D.   Burnell Blanks, M.D.   Eulas Post, M.D.   Lunette Stands, M.D Jewel Baize. Eulah Pont, M.D.  Buford Dresser, M.D.  Estell Harpin, M.D.    Melina Fiddler, M.D. Mary L. Isidoro Donning, PA-C  Kirstin A. Shepperson, PA-C  Josh Ionia, PA-C [place], [place]                                                                    RE: Isabel, Wiley                                    [id number]      DOB: 10-02-1975 PROGRESS NOTE: 11-14-13 Reason for visit: Evaluation, referral from Dr. Farris Has for right shoulder pain.   History of present illness: Isabel Wiley is a pleasant 38 year-old woman who has had Type II diabetes for quite awhile.  She has had right shoulder pain for over a year.  It started, she believes, around the time she was doing a lot of vacuuming and mopping.  She does not remember an acute traumatic injury.  Pain has worsened over the year.  It wakes her up at night.  She has pain daily with most every activity over her shoulders.  She has also noticed some weakness in that shoulder.  She has tried an injection that provided relief for two weeks, but elevated her blood sugars significantly for three weeks.  She has done physical therapy, but this did not provide any relief and she was unable to tolerate additional physical therapy.  She has tried home exercises that did not help and she takes NSAIDs regularly that also do not help.  She is notable for having multiple allergies to multiple medicines.     Please see associated documentation for this clinic visit for further past medical, family, surgical and social history, review of systems, and exam findings as this was reviewed by me.  EXAMINATION: Well appearing female in no apparent distress.  Right upper extremity is  neurovascularly intact.  She has significant tenderness over her AC joint and her biceps tendon.  She has a positive Hawkins.  She has 3+/5 strength in her supraspinatus.  She has good infra and subscapularis strength.  She has a positive O'Brien's as well.    X-RAYS: X-rays reviewed by me: previously obtained plain views demonstrate a Type II acromion.  MRI demonstrates bursal sided partial thickness rotator cuff tear.     ASSESSMENT: She is a 38 year-old woman with likely partial thickness rotator cuff tear, subacromial impingement, biceps tendonitis and distal clavicle arthritis.    PLAN: I had a lengthy discussion with her, over 30 minutes, discussing her options.  She could do nothing, she could continue with physical therapy, she could continue with NSAIDs.  She would not like to do an injection, as it raised her blood sugars significantly for three weeks last time.  Surgical treatment I would recommend would be a manipulation, debridement, distal clavicle excision,  subacromial decompression, biceps tenotomy and evaluation of her rotator cuff for possible repair.  I had a lengthy discussion with her that I am concerned that some of this may have a chronic pain component and we may not get her all the way better.  Understanding this she would like to go forward with the above mentioned surgery, as she has failed all non-operative measures for over a year.      Jewel Baizeimothy D.  Eulah PontMurphy, M.D.  Electronically verified by Jewel Baizeimothy D. Eulah PontMurphy, M.D. TDM:jjh D 11-14-13 T 11-15-13

## 2014-02-08 NOTE — Progress Notes (Signed)
Assisted Dr. Ossey with right, ultrasound guided, interscalene  block. Side rails up, monitors on throughout procedure. See vital signs in flow sheet. Tolerated Procedure well. 

## 2014-02-08 NOTE — Interval H&P Note (Signed)
History and Physical Interval Note:  02/08/2014 8:45 AM  [name]  has presented today for surgery, with the diagnosis of RIGHT ADHESIVE CAPSULTIIS,FROZEN SHOULDER,DEGENERATIVE ARTHRITIS,DISORDER ARTICULAR CARTILAGE,SHOULDER DISORDERS OF BURSAE AND TENDONS IN SHOULDER  The various methods of treatment have been discussed with the patient and family. After consideration of risks, benefits and other options for treatment, the patient has consented to  Procedure(s): RIGHT MANIPULATION SHOULDER UNDER ANESTHESIA,ARTHROSCOPY SHOULDER DISTAL CALVICULECTOMY,DEBRIDEMENT EXTENTSIVE,ARTHROSCOPY SHOULDER DECOMPRESSION SUBACROMIAL PARTILA ACROMIOPLASTY WITH CORACOACROMIAL RELEASE (Right) as a surgical intervention .  The patient's history has been reviewed, patient examined, no change in status, stable for surgery.  I have reviewed the patient's chart and labs.  Questions were answered to the patient's satisfaction.     Pedro Oldenburg, D

## 2014-02-08 NOTE — Op Note (Signed)
02/08/2014  2:38 PM  PATIENT:  Isabel Wiley    PRE-OPERATIVE DIAGNOSIS:  RIGHT ADHESIVE CAPSULTIIS,FROZEN SHOULDER,DEGENERATIVE ARTHRITIS,DISORDER ARTICULAR CARTILAGE,SHOULDER DISORDERS OF BURSAE AND TENDONS IN SHOULDER  POST-OPERATIVE DIAGNOSIS:  Same  PROCEDURE:  RIGHT SHOULDER MANIPULATION UNDER ANESTHESIA; ARTHROSCOPY SHOULDER;  DISTAL CLAVICULECTOMY, EXTENSIVE DEBRIDEMENT; SUBACROMIAL DECOMPRESSION;  PARTIAL  ACROMIOPLASTY WITH CORACOACROMIAL RELEASE, ARTHROSCOPIC ROTATOR CUFF REPAIR  SURGEON:  Zelene Barga, D, MD  ASSISTANT: Janace LittenBrandon Parry OPA  ANESTHESIA:   General  PREOPERATIVE INDICATIONS:  Isabel Balesllison Vokes is a  38 y.o. female with a diagnosis of RIGHT ADHESIVE CAPSULTIIS,FROZEN SHOULDER,DEGENERATIVE ARTHRITIS,DISORDER ARTICULAR CARTILAGE,SHOULDER DISORDERS OF BURSAE AND TENDONS IN SHOULDER who failed conservative measures and elected for surgical management.    The risks benefits and alternatives were discussed with the patient preoperatively including but not limited to the risks of infection, bleeding, nerve injury, cardiopulmonary complications, the need for revision surgery, among others, and the patient was willing to proceed.  OPERATIVE IMPLANTS: SL anchor  OPERATIVE FINDINGS: Full thickness RCT, SAI, AC pain, Good ROM  BLOOD LOSS: minimal  COMPLICATIONS: none  OPERATIVE PROCEDURE:  Patient was identified in the preoperative holding area and site was marked by me He was transported to the operating theater and placed on the table in beach chair position taking care to pad all bony prominences. After a preincinduction time out anesthesia was induced. The right upper extremity was prepped and draped in normal sterile fashion and a pre-incision timeout was performed. Isabel BalesAllison Fodge received clinda for preoperative antibiotics.   Initially made a posterior arthroscopic portal and inserted the arthroscope into the glenohumeral joint. tour of the joint demonstrated  the above operative findings  I created an anterior portal just lateral to the coracoid under direct visualization using a spinal needle. I performed an extensive debridement.   Biceps was without pathology  I then introduced the arthroscope into the subacromial space and brought the shaver into the anterior portal. I debrided the bursa for appropriate visualization.  I then performed a subacromial decompression using combination of the shaver ArthroCare and burr using a cutting block technique. As happy with the final elevation of the subacromial space on multiple portal views.  Next I turned my attention to the distal clavicle and through the anterior portal using the bur and shaver I was able to perform a distal clavicle excision. I then switched portals and inserted the arthroscope into the anterior portal and was happy with an appropriate resection of the distal clavicle.  I debrided the rotator cuff tear and examined its mobility. There was a roughly 5 millimeters tear and I debrided the tear here. I then debrided the footprint to good bone for placement of the tendon. Next I marked the spot for the push locks. I placed a horizontal matress stitch in the tendon and placed a swivel lock anchor. I was happy with the tendon apposition with no dog ears.   Next I removed all arthroscopic equipment expressed all fluid and closed the portals with a nylon stitch. A sterile dressing was applied the patient was taken the PACU in stable condition.  POST OPERATIVE PLAN: The patient will be in a sling full-time and keep the dressings clean dry and intact. DVT prophylaxis will consist of early ambulation

## 2014-02-08 NOTE — Anesthesia Preprocedure Evaluation (Signed)
Anesthesia Evaluation  Patient identified by MRN, date of birth, ID band Patient awake    Reviewed: Allergy & Precautions, H&P , NPO status , Patient's Chart, lab work & pertinent test results  Airway Mallampati: I TM Distance: >3 FB Neck ROM: Full    Dental   Pulmonary asthma , sleep apnea , COPD COPD inhaler, Current Smoker,          Cardiovascular hypertension, Pt. on medications     Neuro/Psych Anxiety    GI/Hepatic GERD-  Medicated and Controlled,  Endo/Other  diabetes, Poorly Controlled, Type 2, Insulin Dependent  Renal/GU      Musculoskeletal   Abdominal   Peds  Hematology   Anesthesia Other Findings   Reproductive/Obstetrics                           Anesthesia Physical Anesthesia Plan  ASA: III  Anesthesia Plan: General   Post-op Pain Management:    Induction: Intravenous  Airway Management Planned: Oral ETT  Additional Equipment:   Intra-op Plan:   Post-operative Plan: Extubation in OR  Informed Consent: I have reviewed the patients History and Physical, chart, labs and discussed the procedure including the risks, benefits and alternatives for the proposed anesthesia with the patient or authorized representative who has indicated his/her understanding and acceptance.     Plan Discussed with: CRNA and Surgeon  Anesthesia Plan Comments:         Anesthesia Quick Evaluation

## 2014-02-11 NOTE — Addendum Note (Signed)
Addendum created 02/11/14 0700 by Lance CoonWesley Gaelen Brager, CRNA   Modules edited: Charges VN

## 2014-02-14 ENCOUNTER — Encounter (HOSPITAL_BASED_OUTPATIENT_CLINIC_OR_DEPARTMENT_OTHER): Payer: Self-pay | Admitting: Orthopedic Surgery

## 2014-03-21 ENCOUNTER — Ambulatory Visit (INDEPENDENT_AMBULATORY_CARE_PROVIDER_SITE_OTHER): Payer: Medicaid Other | Admitting: Endocrinology

## 2014-03-21 ENCOUNTER — Encounter: Payer: Self-pay | Admitting: Endocrinology

## 2014-03-21 ENCOUNTER — Other Ambulatory Visit: Payer: Self-pay

## 2014-03-21 VITALS — BP 122/74 | HR 96 | Temp 98.5°F | Ht 66.0 in | Wt 224.0 lb

## 2014-03-21 DIAGNOSIS — E1049 Type 1 diabetes mellitus with other diabetic neurological complication: Secondary | ICD-10-CM

## 2014-03-21 LAB — HEMOGLOBIN A1C: Hgb A1c MFr Bld: 7.4 % — ABNORMAL HIGH (ref 4.6–6.5)

## 2014-03-21 MED ORDER — INSULIN ASPART 100 UNIT/ML ~~LOC~~ SOLN: SUBCUTANEOUS | Status: AC

## 2014-03-21 NOTE — Patient Instructions (Addendum)
good diet and exercise habits significanly improve the control of your diabetes.  please let me know if you wish to be referred to a dietician.  high blood sugar is very risky to your health.  you should see an eye doctor and dentist every year.   check your blood sugar twice a day.  vary the time of day when you check, between before the 3 meals, and at bedtime.  also check if you have symptoms of your blood sugar being too high or too low.  please keep a record of the readings and bring it to your next appointment here.  You can write it on any piece of paper.  please call us sooner if your blood sugar goes below 70, or if you have a lot of readings over 200. blood tests are being requested for you today.  We'll contact you with results. Please come back for a follow-up appointment in 2 months.

## 2014-03-21 NOTE — Progress Notes (Signed)
Subjective:    Patient ID: Isabel Wiley, female    DOB: May 13, 1976, 38 y.o.   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  GDM: never DKA: never Severe hypoglycemia: never Pancreatitis: never Other info: homozygous for hemochromatosis; she can't undergo weight-loss surgery, due to having medicaid.  Interval history:  she brings a record of her cbg's which i have reviewed today.  It varies from 120-200.  She checks only in am and at hs.  It is in general higher as the day goes on, but it is not a big trend.  pt states she feels better since recent shoulder surgery.   Past Medical History  Diagnosis Date  . Type I (juvenile type) diabetes mellitus without mention of complication, not stated as uncontrolled   . Hemochromatosis   . GERD (gastroesophageal reflux disease)   . Asthma   . Anxiety state, unspecified   . Headache   . Wears glasses   . Sleep apnea     sleep study-was to use a cpap-sent it back  . HTN (hypertension)   . COPD (chronic obstructive pulmonary disease)     Past Surgical History  Procedure Laterality Date  . Upper gi endoscopy    . Tonsillectomy    . Dilation and curettage of uterus      x3  . Cholecystectomy    . Cesarean section  2004  . Abdominal hysterectomy  2004  . Tubal ligation  2004  . Bunionectomy  2006    right and left  . Carpal tunnel release      rt and lt  . Skin cancer excision      back  . Arthoscopic rotaor cuff repair Right 02/08/2014    Procedure: ARTHROSCOPIC ROTATOR CUFF REPAIR;  Surgeon: Renette Butters, MD;  Location: [facility];  Service: Orthopedics;  Laterality: Right;    History   Social History  . Marital Status: Married    Spouse Name: N/A   Number of Children: N/A  . Years of Education: N/A   Occupational History  . Not on file.   Social History Main Topics  . Smoking status: Current Every Day Smoker -- 0.50 packs/day    Types: Cigarettes  . Smokeless tobacco: Not on file  . Alcohol Use: No  . Drug Use: Not on file  . Sexual Activity: Not on file   Other Topics Concern  . Not on file   Social History Narrative  . No narrative on file    Current Outpatient Prescriptions on File Prior to Visit  Medication Sig Dispense Refill  . albuterol (PROVENTIL) (5 MG/ML) 0.5% nebulizer solution Take 2.5 mg by nebulization every 6 (six) hours as needed for wheezing or shortness of breath.      Marland Kitchen atorvastatin (LIPITOR) 10 MG tablet Take 10 mg by mouth daily.      . Blood Glucose Monitoring Suppl (ACCU-CHEK AVIVA PLUS) W/DEVICE KIT 1 Device by Does not apply route once.  1 kit  0  . butalbital-acetaminophen-caffeine (FIORICET WITH CODEINE) 50-325-40-30 MG per capsule Take 1 capsule by mouth every 4 (four) hours as needed for headache.      . cetirizine (ZYRTEC) 10 MG tablet Take 10 mg by mouth daily.      . Choline Fenofibrate (TRILIPIX) 135  MG capsule Take 135 mg by mouth daily.      . diazepam (VALIUM) 5 MG tablet Take 5 mg by mouth every 6 (six) hours as needed for anxiety.      . diphenhydrAMINE (BENADRYL) 50 MG tablet Take 75 mg by mouth as needed for itching.      . docusate sodium (COLACE) 100 MG capsule Take 100 mg by mouth 2 (two) times daily.      Marland Kitchen docusate sodium (COLACE) 100 MG capsule Take 1 capsule (100 mg total) by mouth 2 (two) times daily.  60 capsule  0  . esomeprazole (NEXIUM) 40 MG capsule Take 40 mg by mouth daily at 12 noon.      . gabapentin (NEURONTIN) 300 MG capsule Take 300 mg by mouth 4 (four) times daily. Takes 3=93m      . glucose blood (ACCU-CHEK AVIVA) test strip 1 each by Other route 2 (two) times daily. And lancets 2/day 250.01  100 each  12  . HYDROcodone-acetaminophen (NORCO) 5-325 MG per tablet  Take 1-2 tablets by mouth every 4 (four) hours as needed for moderate pain.  90 tablet  0  . HYDROcodone-acetaminophen (NORCO/VICODIN) 5-325 MG per tablet Take 1 tablet by mouth every 6 (six) hours as needed for moderate pain.      .Marland Kitchenibuprofen (ADVIL,MOTRIN) 800 MG tablet Take 800 mg by mouth every 8 (eight) hours as needed.      . insulin detemir (LEVEMIR) 100 UNIT/ML injection Inject 50 Units into the skin at bedtime.      .Marland Kitchenlosartan (COZAAR) 50 MG tablet Take 50 mg by mouth daily.      . Olopatadine HCl (PATADAY) 0.2 % SOLN Apply to eye.      . ondansetron (ZOFRAN) 4 MG tablet Take 1 tablet (4 mg total) by mouth every 8 (eight) hours as needed for nausea or vomiting.  60 tablet  0  . promethazine (PHENERGAN) 25 MG tablet Take 25 mg by mouth every 6 (six) hours as needed for nausea or vomiting.      . ranitidine (ZANTAC) 150 MG capsule Take 300 mg by mouth every evening.      . SUMAtriptan (IMITREX) 100 MG tablet Take 100 mg by mouth every 2 (two) hours as needed for migraine or headache. May repeat in 2 hours if headache persists or recurs.       No current facility-administered medications on file prior to visit.    Allergies  Allergen Reactions  . Amoxicillin     Hives   . Cefdinir     Yeast infections.   . Celebrex [Celecoxib]     Stings   . Ciprofloxacin     Hives   . Dilaudid [Hydromorphone Hcl]     Hives   . Doxycycline     Hives   . Fluconazole     Hives/blisters   . Hctz [Hydrochlorothiazide]     Hives   . K-Flex [Orphenadrine]     Hives   . Lovaza [Omega-3-Acid Ethyl Esters]     Causes acid   . Metformin And Related     Hives/ upset stomach   . Morphine And Related     Hives   . Niacin And Related     Itching/hives   . Percocet [Oxycodone-Acetaminophen]     Hives    . Ultram [Tramadol]     Hives    . Welchol [Colesevelam Hcl]     Upset stomach   . Zithromax [Azithromycin] Hives  Family History  Problem Relation Age of Onset  . Diabetes Mother   .  Diabetes Maternal Grandmother   . Diabetes Maternal Grandfather     BP 122/74  Pulse 96  Temp(Src) 98.5 F (36.9 C) (Oral)  Ht _0  (1.676 m)  Wt 224 lb (101.606 kg)  BMI 36.17 kg/m2  SpO2 97%    Review of Systems She denies hypoglycemia.  She has gained a few lbs.     Objective:   Physical Exam VITAL SIGNS:  See vs page GENERAL: no distress Pulses: dorsalis pedis intact bilat.   Feet: no deformity.  Trace bilat leg edema.  Old healed surgical scars on both feet (bunions). Skin:  no ulcer on the feet.  normal color and temp. Neuro: sensation is intact to touch on the feet   Lab Results  Component Value Date   HGBA1C 7.4* 03/21/2014       Assessment & Plan:  DM: mild exacerbation Postop for rotator cuff tear, new: this is limiting exercise rx of DM: i encouraged pt to increase activity, as ortho Dr. permits  Patient is advised the following: Patient Instructions  good diet and exercise habits significanly improve the control of your diabetes.  please let me know if you wish to be referred to a dietician.  high blood sugar is very risky to your health.  you should see an eye doctor and dentist every year.   check your blood sugar twice a day.  vary the time of day when you check, between before the 3 meals, and at bedtime.  also check if you have symptoms of your blood sugar being too high or too low.  please keep a record of the readings and bring it to your next appointment here.  You can write it on any piece of paper.  please call us sooner if your blood sugar goes below 70, or if you have a lot of readings over 200. blood tests are being requested for you today.  We'll contact you with results. Please come back for a follow-up appointment in 2 months.    increase novolog to 45 units 3 times a day (just before each meal).

## 2014-03-27 LAB — HM DIABETES EYE EXAM

## 2014-05-21 ENCOUNTER — Ambulatory Visit: Payer: Medicaid Other | Admitting: Endocrinology

## 2015-12-03 ENCOUNTER — Ambulatory Visit: Payer: Medicaid Other | Admitting: Endocrinology

## 2015-12-19 ENCOUNTER — Ambulatory Visit: Payer: Medicaid Other | Admitting: Endocrinology

## 2016-02-02 IMAGING — MR MR SHOULDER*R* W/O CM
4 of 5 series · 19 of 40 positions shown · non-contrast
Comparison: Pain films shoulder 05/29/2013

CLINICAL DATA: Right shoulder pain for almost a year.

EXAM:
MRI OF THE RIGHT SHOULDER WITHOUT CONTRAST
TECHNIQUE: Multiplanar, multisequence MR imaging of the shoulder was performed.
No intravenous contrast was administered.

[Series 5: T2 fat-sat · axial · 4.0mm · 0.27mm/px · z∈[-11,+53]mm · 8 of 16 slices shown (1 of 2)]
[im 1/16]
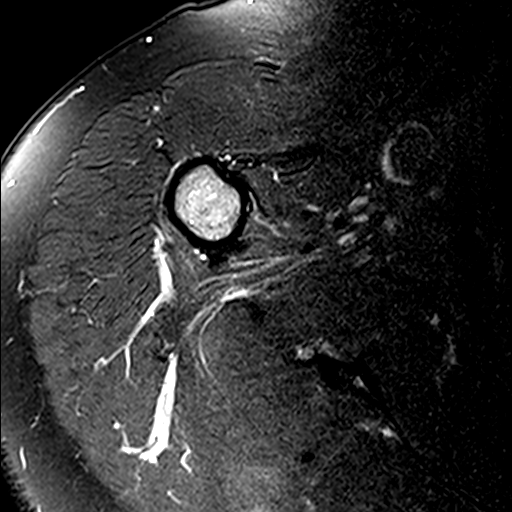
[im 3/16]
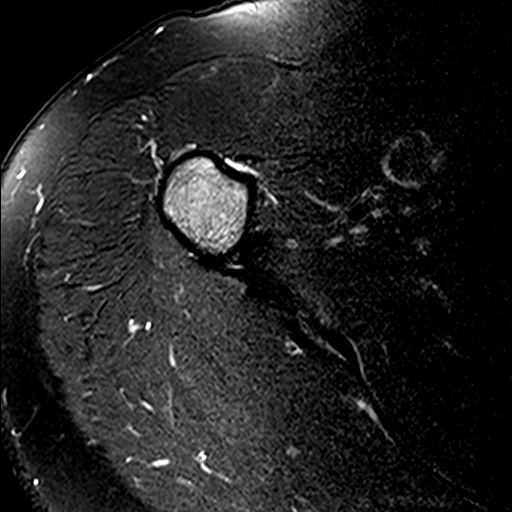
[im 5/16]
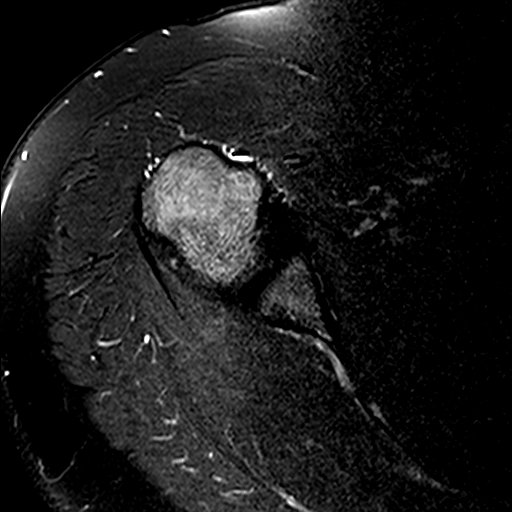
[im 7/16]
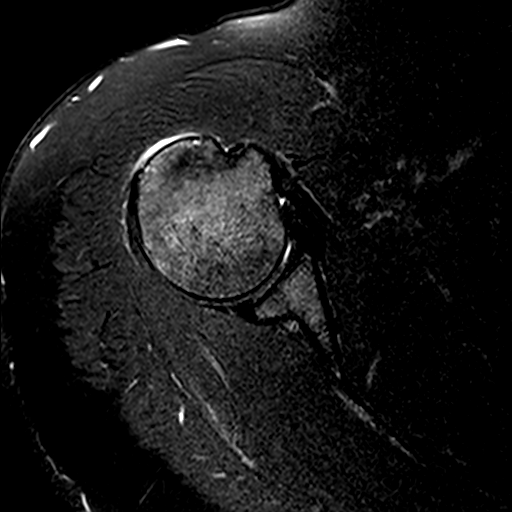
[im 9/16]
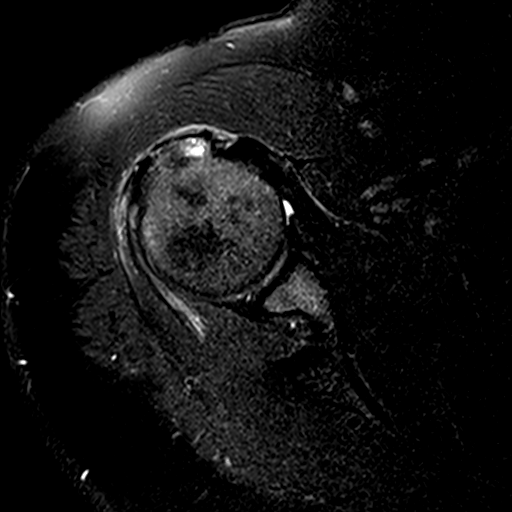
[im 11/16]
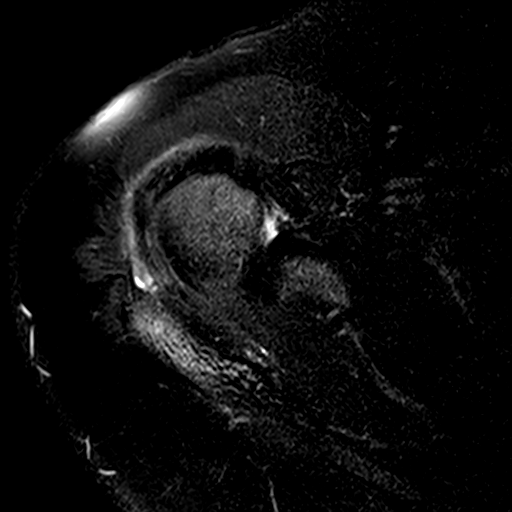
[im 13/16]
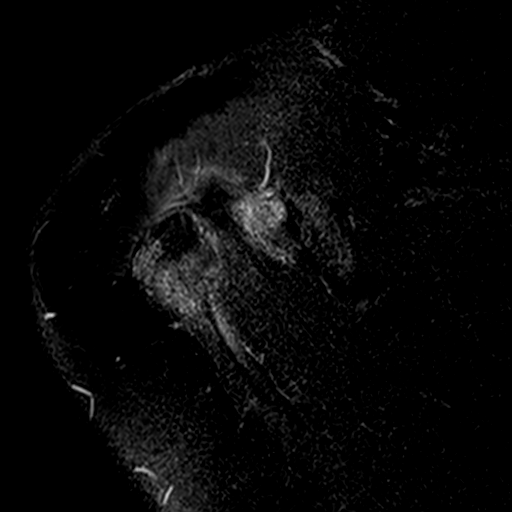
[im 16/16]
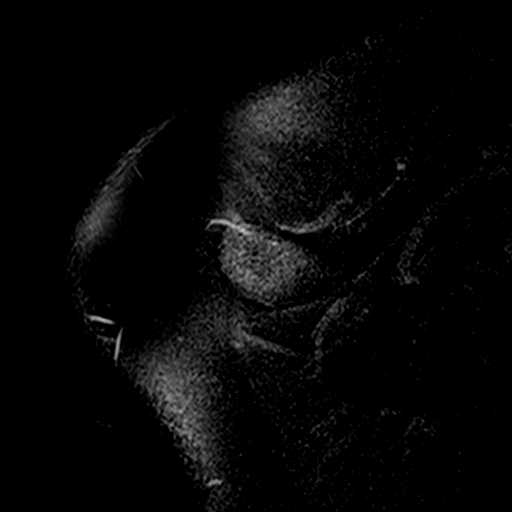

[Series 7: t2_tse_cor · oblique · 4.0mm · 0.31mm/px · 3 of 16 slices shown]
[im 3/16]
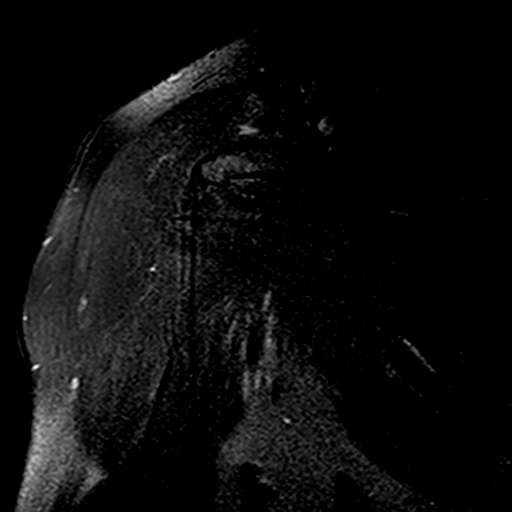
[im 9/16]
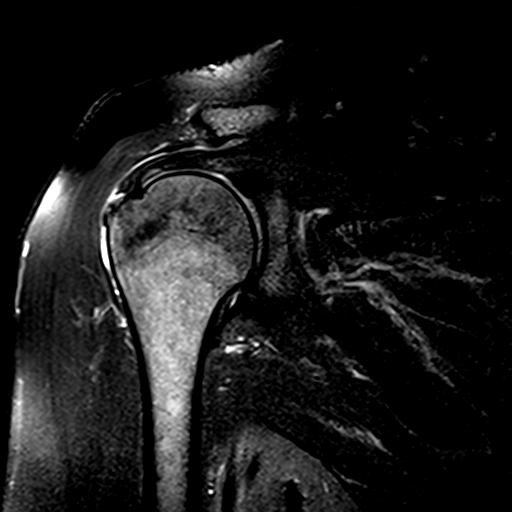
[im 13/16]
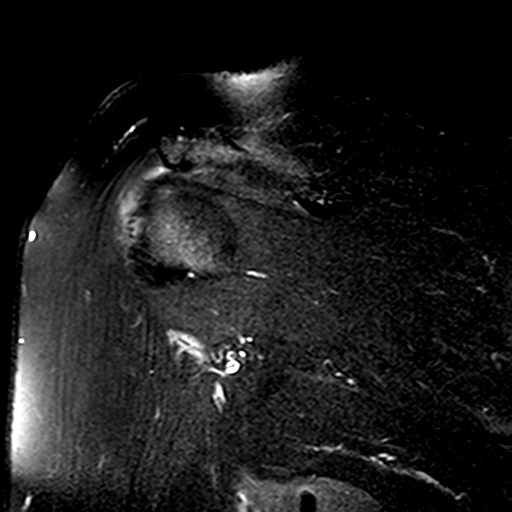

[Series 9: T1 · oblique · 4.0mm · 0.31mm/px · 3 of 16 slices shown]
[im 3/16]
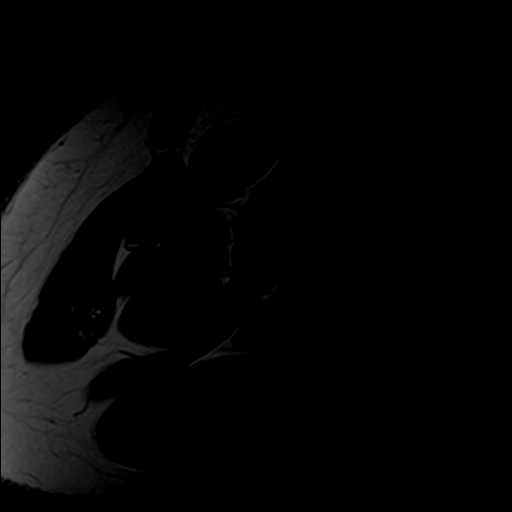
[im 9/16]
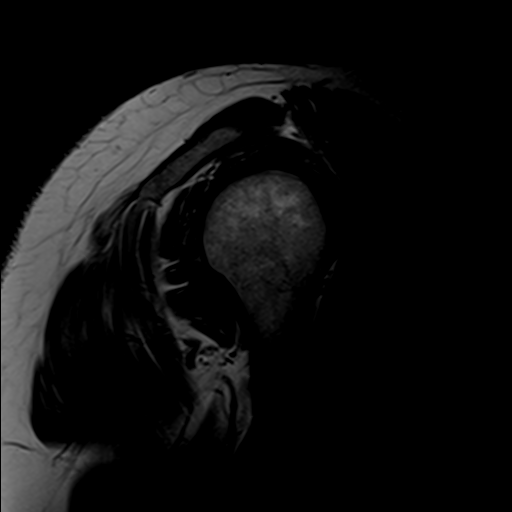
[im 13/16]
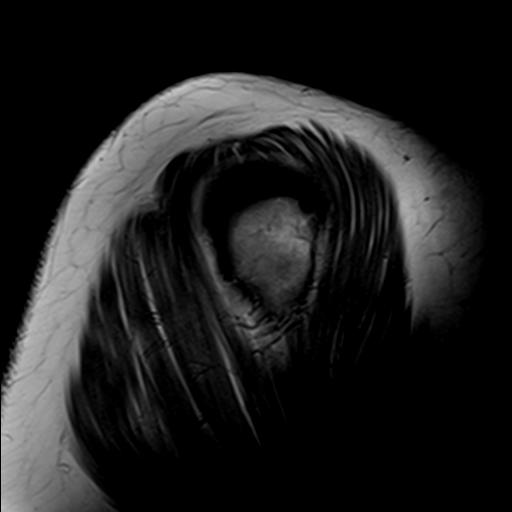

[Series 10: T2 fat-sat · oblique · 4.0mm · 0.31mm/px · 5 of 16 slices shown (2 of 2)]
[im 1/16]
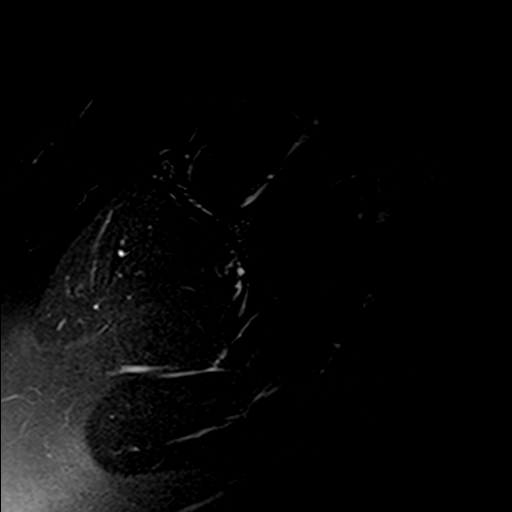
[im 3/16]
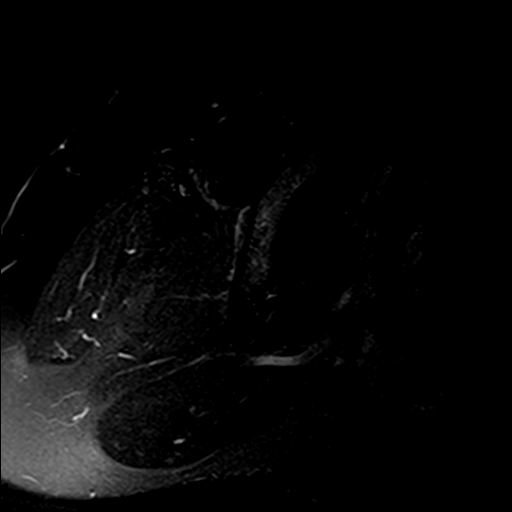
[im 5/16]
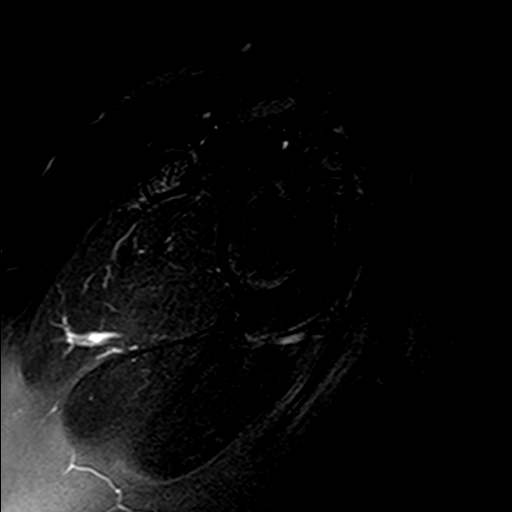
[im 9/16]
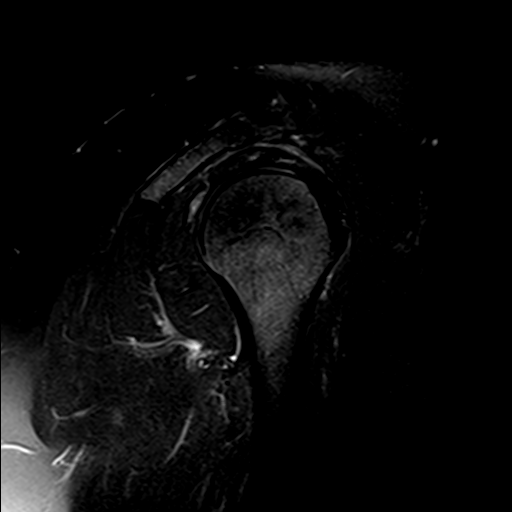
[im 13/16]
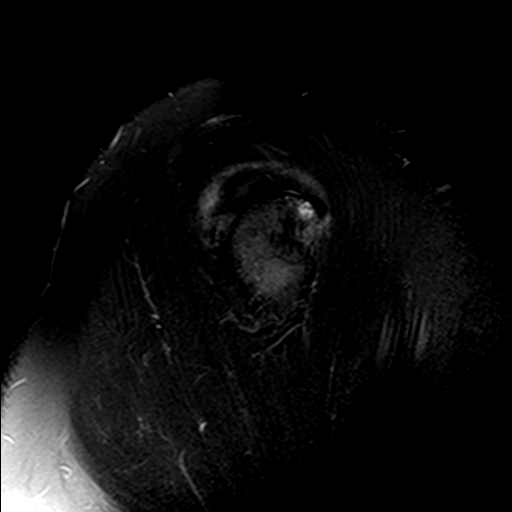

[19 of 40 positions shown; findings below may reference images not displayed]

FINDINGS: Rotator cuff: The patient has supraspinatus, infraspinatus and
subscapularis tendinopathy. There is a very small bursal sided tear
of the anterior and far lateral supraspinatus measuring only 0.4 cm
from front to back. There is no retraction. The infraspinatus and
subscapularis are intact.

Muscles:  No atrophy or focal lesion.

Biceps long head:  Intact.

Acromioclavicular Joint: Mild to moderate degenerative disease is
identified.

Glenohumeral Joint: Unremarkable.

Labrum:  Intact.

Bones: The acromion is type 1. There is some subacromial spurring.
No worrisome marrow lesion. Fluid is present in the
subacromial/subdeltoid bursa.
IMPRESSION: Rotator cuff tendinopathy with a very small bursal sided tear of the
anterior and far lateral supraspinatus without retraction or
atrophy.

Mild to moderate acromioclavicular degenerative disease. Subacromial
spurring also identified.

Subacromial/subdeltoid fluid consistent with bursitis.
